# Patient Record
Sex: Male | Born: 1950 | Hispanic: Yes | Marital: Married | Smoking: Former smoker
Health system: Southern US, Community
[De-identification: ages and names within clinical notes are randomized; demographics above are authoritative.]

## PROBLEM LIST (undated history)

## (undated) HISTORY — PX: VARICOSE VEIN SURGERY: SHX832

---

## 2014-12-12 ENCOUNTER — Encounter: Payer: Self-pay | Admitting: Emergency Medicine

## 2014-12-12 ENCOUNTER — Emergency Department: Payer: BLUE CROSS/BLUE SHIELD

## 2014-12-12 ENCOUNTER — Emergency Department
Admission: EM | Admit: 2014-12-12 | Discharge: 2014-12-12 | Disposition: A | Discharge status: Home / Self Care | Payer: BLUE CROSS/BLUE SHIELD | Attending: Student | Admitting: Student

## 2014-12-12 DIAGNOSIS — M549 Dorsalgia, unspecified: Secondary | ICD-10-CM | POA: Diagnosis present

## 2014-12-12 DIAGNOSIS — M4686 Other specified inflammatory spondylopathies, lumbar region: Secondary | ICD-10-CM | POA: Insufficient documentation

## 2014-12-12 DIAGNOSIS — Z87891 Personal history of nicotine dependence: Secondary | ICD-10-CM | POA: Diagnosis not present

## 2014-12-12 DIAGNOSIS — M79661 Pain in right lower leg: Secondary | ICD-10-CM | POA: Diagnosis not present

## 2014-12-12 DIAGNOSIS — M479 Spondylosis, unspecified: Secondary | ICD-10-CM

## 2014-12-12 LAB — URINALYSIS COMPLETE WITH MICROSCOPIC (ARMC ONLY)
Bacteria, UA: NONE SEEN
Bilirubin Urine: NEGATIVE
Glucose, UA: NEGATIVE mg/dL
Ketones, ur: NEGATIVE mg/dL
Leukocytes, UA: NEGATIVE
Nitrite: NEGATIVE
Protein, ur: NEGATIVE mg/dL
Specific Gravity, Urine: 1.016 (ref 1.005–1.030)
pH: 7 (ref 5.0–8.0)

## 2014-12-12 MED ORDER — IBUPROFEN 800 MG PO TABS
800.0000 mg | ORAL_TABLET | Freq: Once | ORAL | Status: AC
  Administered 2014-12-12: 800 mg via ORAL
  Filled 2014-12-12: qty 1

## 2014-12-12 MED ORDER — CYCLOBENZAPRINE HCL 10 MG PO TABS
5.0000 mg | ORAL_TABLET | Freq: Once | ORAL | Status: AC
Start: 2014-12-12 — End: 2014-12-12
  Administered 2014-12-12: 5 mg via ORAL
  Filled 2014-12-12: qty 1

## 2014-12-12 MED ORDER — MELOXICAM 15 MG PO TABS: 15.0000 mg | ORAL_TABLET | Freq: Every day | ORAL | Status: AC

## 2014-12-12 MED ORDER — TRAMADOL HCL 50 MG PO TABS
50.0000 mg | ORAL_TABLET | Freq: Once | ORAL | Status: AC
  Administered 2014-12-12: 50 mg via ORAL
  Filled 2014-12-12: qty 1

## 2014-12-12 MED ORDER — TRAMADOL HCL 50 MG PO TABS: 50.0000 mg | ORAL_TABLET | Freq: Four times a day (QID) | ORAL | Status: AC | PRN

## 2014-12-12 MED ORDER — CYCLOBENZAPRINE HCL 10 MG PO TABS: 10.0000 mg | ORAL_TABLET | Freq: Three times a day (TID) | ORAL | Status: AC | PRN

## 2014-12-12 NOTE — ED Notes (Signed)
Patient presents to the ED with lower back pain that started 3 days ago and right leg pain that started today.  Patient denies any injury.  No obvious swelling or redness to right leg.  Patient ambulatory, able to move feet and wiggle toes.

## 2014-12-12 NOTE — ED Notes (Signed)
Pt has right lower back pain x 4 days and now pain radiates into right leg/foot.  States painful to stand and walk.  No known injury

## 2014-12-12 NOTE — ED Provider Notes (Signed)
[facility] Emergency Department Provider Note  ____________________________________________  Time seen: Approximately 7:34 PM  I have reviewed the triage vital signs and the nursing notes.   HISTORY  Chief Complaint Leg Pain and Back Pain    HPI Jeremiah Stewart is a 64 y.o. male patient complaining of 3 days of back pain radiating down the right leg. He is also complaining of right flank pain. Patient denies any provocative incident for this complaint. Patient stated no palliative measures taken for this complaint. No prior history of back pain. Patient rates his pain as 8/10. Patient describes pain as sharp.   History reviewed. No pertinent past medical history.  There are no active problems to display for this patient.   Past Surgical History  Procedure Laterality Date  . Varicose vein surgery      No current outpatient prescriptions on file.  Allergies Review of patient's allergies indicates no known allergies.  No family history on file.  Social History History  Substance Use Topics  . Smoking status: Former Smoker -- 10 years  . Smokeless tobacco: Not on file  . Alcohol Use: No    Review of Systems Constitutional: No fever/chills Eyes: No visual changes. ENT: No sore throat. Cardiovascular: Denies chest pain. Respiratory: Denies shortness of breath. Gastrointestinal: No abdominal pain.  No nausea, no vomiting.  No diarrhea.  No constipation. Genitourinary: Negative for dysuria. Musculoskeletal: Positive for back pain. Skin: Negative for rash. Neurological: Negative for headaches, focal weakness or numbness.  10-point ROS otherwise negative.  ____________________________________________   PHYSICAL EXAM:  VITAL SIGNS: ED Triage Vitals  Enc Vitals Group     BP 12/12/14 1840 151/85 mmHg     Pulse Rate 12/12/14 1840 84     Resp 12/12/14 1840 16     Temp 12/12/14 1840 97.8 F (36.6 C)     Temp Source 12/12/14 1840 Oral      SpO2 12/12/14 1840 95 %     Weight 12/12/14 1840 241 lb (109.317 kg)     Height 12/12/14 1840 5\' 8"  (1.727 m)     Head Cir --      Peak Flow --      Pain Score 12/12/14 1841 8     Pain Loc --      Pain Edu? --      Excl. in GC? --     Constitutional: Alert and oriented. Well appearing and in no acute distress. Eyes: Conjunctivae are normal. PERRL. EOMI. Head: Atraumatic. Nose: No congestion/rhinnorhea. Mouth/Throat: Mucous membranes are moist.  Oropharynx non-erythematous. Neck: No stridor.  No cervical spine tenderness to palpation. Hematological/Lymphatic/Immunilogical: No cervical lymphadenopathy. Cardiovascular: Normal rate, regular rhythm. Grossly normal heart sounds.  Good peripheral circulation. Respiratory: Normal respiratory effort.  No retractions. Lungs CTAB. Gastrointestinal: Soft and nontender. No distention. No abdominal bruits. No CVA tenderness. Musculoskeletal: No spinal deformity decreased range of motion right lateral movements. Paraspinal muscle spasms on the right side. Patient negative straight leg test. Neurologic:  Normal speech and language. No gross focal neurologic deficits are appreciated. No gait instability. Skin:  Skin is warm, dry and intact. No rash noted. Psychiatric: Mood and affect are normal. Speech and behavior are normal.  ____________________________________________   LABS (all labs ordered are listed, but only abnormal results are displayed)  Labs Reviewed  URINALYSIS COMPLETEWITH MICROSCOPIC ([facility] ONLY) - Abnormal; Notable for the following:    Color, Urine YELLOW (*)    APPearance CLEAR (*)    Hgb urine dipstick 2+ (*)  Squamous Epithelial / LPF 0-5 (*)    All other components within normal limits   ____________________________________________  EKG  ____________________________________________  RADIOLOGY  CT scan was negative for kidney stones. Multilevel arthritic changes were found on x-ray  . ____________________________________________   PROCEDURES  Procedure(s) performed: None  Critical Care performed: No  ____________________________________________   INITIAL IMPRESSION / ASSESSMENT AND PLAN / ED COURSE  Pertinent labs & imaging results that were available during my care of the patient were reviewed by me and considered in my medical decision making (see chart for details).  Arthritis. Discuss CT and x-ray results with patient. Patient was given a prescription for [place] tramadol and Flexeril. [facility]. Advised return to ER if condition worsens. ____________________________________________   FINAL CLINICAL IMPRESSION(S) / ED DIAGNOSES  Final diagnoses:  Arthritis of back      Joni Reining, PA-C 12/12/14 2135  Gayla Doss, MD 12/12/14 2350

## 2015-05-20 ENCOUNTER — Emergency Department: Payer: BLUE CROSS/BLUE SHIELD

## 2015-05-20 ENCOUNTER — Emergency Department
Admission: EM | Admit: 2015-05-20 | Discharge: 2015-05-20 | Disposition: A | Discharge status: Home / Self Care | Payer: BLUE CROSS/BLUE SHIELD | Attending: Emergency Medicine | Admitting: Emergency Medicine

## 2015-05-20 DIAGNOSIS — Z87891 Personal history of nicotine dependence: Secondary | ICD-10-CM | POA: Diagnosis not present

## 2015-05-20 DIAGNOSIS — Y9289 Other specified places as the place of occurrence of the external cause: Secondary | ICD-10-CM | POA: Insufficient documentation

## 2015-05-20 DIAGNOSIS — S0083XA Contusion of other part of head, initial encounter: Secondary | ICD-10-CM

## 2015-05-20 DIAGNOSIS — Y998 Other external cause status: Secondary | ICD-10-CM | POA: Insufficient documentation

## 2015-05-20 DIAGNOSIS — S060X0A Concussion without loss of consciousness, initial encounter: Secondary | ICD-10-CM | POA: Diagnosis not present

## 2015-05-20 DIAGNOSIS — S0121XA Laceration without foreign body of nose, initial encounter: Secondary | ICD-10-CM | POA: Diagnosis not present

## 2015-05-20 DIAGNOSIS — Z23 Encounter for immunization: Secondary | ICD-10-CM | POA: Diagnosis not present

## 2015-05-20 DIAGNOSIS — Z791 Long term (current) use of non-steroidal anti-inflammatories (NSAID): Secondary | ICD-10-CM | POA: Insufficient documentation

## 2015-05-20 DIAGNOSIS — Y9389 Activity, other specified: Secondary | ICD-10-CM | POA: Insufficient documentation

## 2015-05-20 DIAGNOSIS — S0181XA Laceration without foreign body of other part of head, initial encounter: Secondary | ICD-10-CM

## 2015-05-20 DIAGNOSIS — S0990XA Unspecified injury of head, initial encounter: Secondary | ICD-10-CM | POA: Diagnosis present

## 2015-05-20 MED ORDER — TETANUS-DIPHTH-ACELL PERTUSSIS 5-2.5-18.5 LF-MCG/0.5 IM SUSP
0.5000 mL | Freq: Once | INTRAMUSCULAR | Status: AC
  Administered 2015-05-20: 0.5 mL via INTRAMUSCULAR
  Filled 2015-05-20: qty 0.5

## 2015-05-20 MED ORDER — HYDROCODONE-ACETAMINOPHEN 5-325 MG PO TABS
1.0000 | ORAL_TABLET | ORAL | Status: AC
  Administered 2015-05-20: 1 via ORAL
  Filled 2015-05-20: qty 1

## 2015-05-20 MED ORDER — HYDROCODONE-ACETAMINOPHEN 5-325 MG PO TABS: 1.0000 | ORAL_TABLET | Freq: Four times a day (QID) | ORAL | Status: AC | PRN

## 2015-05-20 NOTE — Discharge Instructions (Signed)
Concussion, Adult A concussion, or closed-head injury, is a brain injury caused by a direct blow to the head or by a quick and sudden movement (jolt) of the head or neck. Concussions are usually not life-threatening. Even so, the effects of a concussion can be serious. If you have had a concussion before, you are more likely to experience concussion-like symptoms after a direct blow to the head.  CAUSES  Direct blow to the head, such as from running into another player during a soccer game, being hit in a fight, or hitting your head on a hard surface.  A jolt of the head or neck that causes the brain to move back and forth inside the skull, such as in a car crash. SIGNS AND SYMPTOMS The signs of a concussion can be hard to notice. Early on, they may be missed by you, family members, and health care providers. You may look fine but act or feel differently. Symptoms are usually temporary, but they may last for days, weeks, or even longer. Some symptoms may appear right away while others may not show up for hours or days. Every head injury is different. Symptoms include:  Mild to moderate headaches that will not go away.  A feeling of pressure inside your head.  Having more trouble than usual:  Learning or remembering things you have heard.  Answering questions.  Paying attention or concentrating.  Organizing daily tasks.  Making decisions and solving problems.  Slowness in thinking, acting or reacting, speaking, or reading.  Getting lost or being easily confused.  Feeling tired all the time or lacking energy (fatigued).  Feeling drowsy.  Sleep disturbances.  Sleeping more than usual.  Sleeping less than usual.  Trouble falling asleep.  Trouble sleeping (insomnia).  Loss of balance or feeling lightheaded or dizzy.  Nausea or vomiting.  Numbness or tingling.  Increased sensitivity to:  Sounds.  Lights.  Distractions.  Vision problems or eyes that tire  easily.  Diminished sense of taste or smell.  Ringing in the ears.  Mood changes such as feeling sad or anxious.  Becoming easily irritated or angry for little or no reason.  Lack of motivation.  Seeing or hearing things other people do not see or hear (hallucinations). DIAGNOSIS Your health care provider can usually diagnose a concussion based on a description of your injury and symptoms. He or she will ask whether you passed out (lost consciousness) and whether you are having trouble remembering events that happened right before and during your injury. Your evaluation might include:  A brain scan to look for signs of injury to the brain. Even if the test shows no injury, you may still have a concussion.  Blood tests to be sure other problems are not present. TREATMENT  Concussions are usually treated in an emergency department, in urgent care, or at a clinic. You may need to stay in the hospital overnight for further treatment.  Tell your health care provider if you are taking any medicines, including prescription medicines, over-the-counter medicines, and natural remedies. Some medicines, such as blood thinners (anticoagulants) and aspirin, may increase the chance of complications. Also tell your health care provider whether you have had alcohol or are taking illegal drugs. This information may affect treatment.  Your health care provider will send you home with important instructions to follow.  How fast you will recover from a concussion depends on many factors. These factors include how severe your concussion is, what part of your brain was injured,  your age, and how healthy you were before the concussion.  Most people with mild injuries recover fully. Recovery can take time. In general, recovery is slower in older persons. Also, persons who have had a concussion in the past or have other medical problems may find that it takes longer to recover from their current injury. HOME  CARE INSTRUCTIONS General Instructions  Carefully follow the directions your health care provider gave you.  Only take over-the-counter or prescription medicines for pain, discomfort, or fever as directed by your health care provider.  Take only those medicines that your health care provider has approved.  Do not drink alcohol until your health care provider says you are well enough to do so. Alcohol and certain other drugs may slow your recovery and can put you at risk of further injury.  If it is harder than usual to remember things, write them down.  If you are easily distracted, try to do one thing at a time. For example, do not try to watch TV while fixing dinner.  Talk with family members or close friends when making important decisions.  Keep all follow-up appointments. Repeated evaluation of your symptoms is recommended for your recovery.  Watch your symptoms and tell others to do the same. Complications sometimes occur after a concussion. Older adults with a brain injury may have a higher risk of serious complications, such as a blood clot on the brain.  Tell your teachers, school nurse, school counselor, coach, athletic trainer, or work Freight forwarder about your injury, symptoms, and restrictions. Tell them about what you can or cannot do. They should watch for:  Increased problems with attention or concentration.  Increased difficulty remembering or learning new information.  Increased time needed to complete tasks or assignments.  Increased irritability or decreased ability to cope with stress.  Increased symptoms.  Rest. Rest helps the brain to heal. Make sure you:  Get plenty of sleep at night. Avoid staying up late at night.  Keep the same bedtime hours on weekends and weekdays.  Rest during the day. Take daytime naps or rest breaks when you feel tired.  Limit activities that require a lot of thought or concentration. These include:  Doing homework or job-related  work.  Watching TV.  Working on the computer.  Avoid any situation where there is potential for another head injury (football, hockey, soccer, basketball, martial arts, downhill snow sports and horseback riding). Your condition will get worse every time you experience a concussion. You should avoid these activities until you are evaluated by the appropriate follow-up health care providers. Returning To Your Regular Activities You will need to return to your normal activities slowly, not all at once. You must give your body and brain enough time for recovery.  Do not return to sports or other athletic activities until your health care provider tells you it is safe to do so.  Ask your health care provider when you can drive, ride a bicycle, or operate heavy machinery. Your ability to react may be slower after a brain injury. Never do these activities if you are dizzy.  Ask your health care provider about when you can return to work or school. Preventing Another Concussion It is very important to avoid another brain injury, especially before you have recovered. In rare cases, another injury can lead to permanent brain damage, brain swelling, or death. The risk of this is greatest during the first 7-10 days after a head injury. Avoid injuries by:  Wearing a  seat belt when riding in a car.  Drinking alcohol only in moderation.  Wearing a helmet when biking, skiing, skateboarding, skating, or doing similar activities.  Avoiding activities that could lead to a second concussion, such as contact or recreational sports, until your health care provider says it is okay.  Taking safety measures in your home.  Remove clutter and tripping hazards from floors and stairways.  Use grab bars in bathrooms and handrails by stairs.  Place non-slip mats on floors and in bathtubs.  Improve lighting in dim areas. SEEK MEDICAL CARE IF:  You have increased problems paying attention or  concentrating.  You have increased difficulty remembering or learning new information.  You need more time to complete tasks or assignments than before.  You have increased irritability or decreased ability to cope with stress.  You have more symptoms than before. Seek medical care if you have any of the following symptoms for more than 2 weeks after your injury:  Lasting (chronic) headaches.  Dizziness or balance problems.  Nausea.  Vision problems.  Increased sensitivity to noise or light.  Depression or mood swings.  Anxiety or irritability.  Memory problems.  Difficulty concentrating or paying attention.  Sleep problems.  Feeling tired all the time. SEEK IMMEDIATE MEDICAL CARE IF:  You have severe or worsening headaches. These may be a sign of a blood clot in the brain.  You have weakness (even if only in one hand, leg, or part of the face).  You have numbness.  You have decreased coordination.  You vomit repeatedly.  You have increased sleepiness.  One pupil is larger than the other.  You have convulsions.  You have slurred speech.  You have increased confusion. This may be a sign of a blood clot in the brain.  You have increased restlessness, agitation, or irritability.  You are unable to recognize people or places.  You have neck pain.  It is difficult to wake you up.  You have unusual behavior changes.  You lose consciousness. MAKE SURE YOU:  Understand these instructions.  Will watch your condition.  Will get help right away if you are not doing well or get worse.   This information is not intended to replace advice given to you by your health care provider. Make sure you discuss any questions you have with your health care provider.   Document Released: 07/14/2003 Document Revised: 05/14/2014 Document Reviewed: 11/13/2012 Elsevier Interactive Patient Education 2016 [place] A contusion is a deep bruise.  Contusions are the result of a blunt injury to tissues and muscle fibers under the skin. The injury causes bleeding under the skin. The skin overlying the contusion may turn blue, purple, or yellow. Minor injuries will give you a painless contusion, but more severe contusions may stay painful and swollen for a few weeks.  CAUSES  This condition is usually caused by a blow, trauma, or direct force to an area of the body. SYMPTOMS  Symptoms of this condition include:  Swelling of the injured area.  Pain and tenderness in the injured area.  Discoloration. The area may have redness and then turn blue, purple, or yellow. DIAGNOSIS  This condition is diagnosed based on a physical exam and medical history. An X-ray, CT scan, or MRI may be needed to determine if there are any associated injuries, such as broken bones (fractures). TREATMENT  Specific treatment for this condition depends on what area of the body was injured. In general, the best treatment  for a contusion is resting, icing, applying pressure to (compression), and elevating the injured area. This is often called the RICE strategy. Over-the-counter anti-inflammatory medicines may also be recommended for pain control.  HOME CARE INSTRUCTIONS   Rest the injured area.  If directed, apply ice to the injured area:  Put ice in a plastic bag.  Place a towel between your skin and the bag.  Leave the ice on for 20 minutes, 2-3 times per day.  If directed, apply light compression to the injured area using an elastic bandage. Make sure the bandage is not wrapped too tightly. Remove and reapply the bandage as directed by your health care provider.  If possible, raise (elevate) the injured area above the level of your heart while you are sitting or lying down.  Take over-the-counter and prescription medicines only as told by your health care provider. SEEK MEDICAL CARE IF:  Your symptoms do not improve after several days of  treatment.  Your symptoms get worse.  You have difficulty moving the injured area. SEEK IMMEDIATE MEDICAL CARE IF:   You have severe pain.  You have numbness in a hand or foot.  Your hand or foot turns pale or cold.   This information is not intended to replace advice given to you by your health care provider. Make sure you discuss any questions you have with your health care provider.   Document Released: 01/31/2005 Document Revised: 01/12/2015 Document Reviewed: 09/08/2014 Elsevier Interactive Patient Education 2016 [employer]  Cryotherapy Cryotherapy means treatment with cold. Ice or gel packs can be used to reduce both pain and swelling. Ice is the most helpful within the first 24 to 48 hours after an injury or flare-up from overusing a muscle or joint. Sprains, strains, spasms, burning pain, shooting pain, and aches can all be eased with ice. Ice can also be used when recovering from surgery. Ice is effective, has very few side effects, and is safe for most people to use. PRECAUTIONS  Ice is not a safe treatment option for people with:  Raynaud phenomenon. This is a condition affecting small blood vessels in the extremities. Exposure to cold may cause your problems to return.  Cold hypersensitivity. There are many forms of cold hypersensitivity, including:  Cold urticaria. Red, itchy hives appear on the skin when the tissues begin to warm after being iced.  Cold erythema. This is a red, itchy rash caused by exposure to cold.  Cold hemoglobinuria. Red blood cells break down when the tissues begin to warm after being iced. The hemoglobin that carry oxygen are passed into the urine because they cannot combine with blood proteins fast enough.  Numbness or altered sensitivity in the area being iced. If you have any of the following conditions, do not use ice until you have discussed cryotherapy with your caregiver:  Heart conditions, such as arrhythmia, angina, or chronic  heart disease.  High blood pressure.  Healing wounds or open skin in the area being iced.  Current infections.  Rheumatoid arthritis.  Poor circulation.  Diabetes. Ice slows the blood flow in the region it is applied. This is beneficial when trying to stop inflamed tissues from spreading irritating chemicals to surrounding tissues. However, if you expose your skin to cold temperatures for too long or without the proper protection, you can damage your skin or nerves. Watch for signs of skin damage due to cold. HOME CARE INSTRUCTIONS Follow these tips to use ice and cold packs safely.  Place a dry  or damp towel between the ice and skin. A damp towel will cool the skin more quickly, so you may need to shorten the time that the ice is used.  For a more rapid response, add gentle compression to the ice.  Ice for no more than 10 to 20 minutes at a time. The bonier the area you are icing, the less time it will take to get the benefits of ice.  Check your skin after 5 minutes to make sure there are no signs of a poor response to cold or skin damage.  Rest 20 minutes or more between uses.  Once your skin is numb, you can end your treatment. You can test numbness by very lightly touching your skin. The touch should be so light that you do not see the skin dimple from the pressure of your fingertip. When using ice, most people will feel these normal sensations in this order: cold, burning, aching, and numbness.  Do not use ice on someone who cannot communicate their responses to pain, such as small children or people with dementia. HOW TO MAKE AN ICE PACK Ice packs are the most common way to use ice therapy. Other methods include ice massage, ice baths, and cryosprays. Muscle creams that cause a cold, tingly feeling do not offer the same benefits that ice offers and should not be used as a substitute unless recommended by your caregiver. To make an ice pack, do one of the following:  Place  crushed ice or a bag of frozen vegetables in a sealable plastic bag. Squeeze out the excess air. Place this bag inside another plastic bag. Slide the bag into a pillowcase or place a damp towel between your skin and the bag.  Mix 3 parts water with 1 part rubbing alcohol. Freeze the mixture in a sealable plastic bag. When you remove the mixture from the freezer, it will be slushy. Squeeze out the excess air. Place this bag inside another plastic bag. Slide the bag into a pillowcase or place a damp towel between your skin and the bag. SEEK MEDICAL CARE IF:  You develop white spots on your skin. This may give the skin a blotchy (mottled) appearance.  Your skin turns blue or pale.  Your skin becomes waxy or hard.  Your swelling gets worse. MAKE SURE YOU:   Understand these instructions.  Will watch your condition.  Will get help right away if you are not doing well or get worse.   This information is not intended to replace advice given to you by your health care provider. Make sure you discuss any questions you have with your health care provider.   Document Released: 12/18/2010 Document Revised: 05/14/2014 Document Reviewed: 12/18/2010 Elsevier Interactive Patient Education 2016 [employer]  Facial Laceration  A facial laceration is a cut on the face. These injuries can be painful and cause bleeding. Lacerations usually heal quickly, but they need special care to reduce scarring. DIAGNOSIS  Your health care provider will take a medical history, ask for details about how the injury occurred, and examine the wound to determine how deep the cut is. TREATMENT  Some facial lacerations may not require closure. Others may not be able to be closed because of an increased risk of infection. The risk of infection and the chance for successful closure will depend on various factors, including the amount of time since the injury occurred. The wound may be cleaned to help prevent infection. If  closure is appropriate, pain medicines  may be given if needed. Your health care provider will use stitches (sutures), wound glue (adhesive), or skin adhesive strips to repair the laceration. These tools bring the skin edges together to allow for faster healing and a better cosmetic outcome. If needed, you may also be given a tetanus shot. HOME CARE INSTRUCTIONS  Only take over-the-counter or prescription medicines as directed by your health care provider.  Follow your health care provider's instructions for wound care. These instructions will vary depending on the technique used for closing the wound. For Sutures:  Keep the wound clean and dry.   If you were given a bandage (dressing), you should change it at least once a day. Also change the dressing if it becomes wet or dirty, or as directed by your health care provider.   Wash the wound with soap and water 2 times a day. Rinse the wound off with water to remove all soap. Pat the wound dry with a clean towel.   After cleaning, apply a thin layer of the antibiotic ointment recommended by your health care provider. This will help prevent infection and keep the dressing from sticking.   You may shower as usual after the first 24 hours. Do not soak the wound in water until the sutures are removed.   Get your sutures removed as directed by your health care provider. With facial lacerations, sutures should usually be taken out after 4-5 days to avoid stitch marks.   Wait a few days after your sutures are removed before applying any makeup. For Skin Adhesive Strips:  Keep the wound clean and dry.   Do not get the skin adhesive strips wet. You may bathe carefully, using caution to keep the wound dry.   If the wound gets wet, pat it dry with a clean towel.   Skin adhesive strips will fall off on their own. You may trim the strips as the wound heals. Do not remove skin adhesive strips that are still stuck to the wound. They will fall  off in time.  For Wound Adhesive:  You may briefly wet your wound in the shower or bath. Do not soak or scrub the wound. Do not swim. Avoid periods of heavy sweating until the skin adhesive has fallen off on its own. After showering or bathing, gently pat the wound dry with a clean towel.   Do not apply liquid medicine, cream medicine, ointment medicine, or makeup to your wound while the skin adhesive is in place. This may loosen the film before your wound is healed.   If a dressing is placed over the wound, be careful not to apply tape directly over the skin adhesive. This may cause the adhesive to be pulled off before the wound is healed.   Avoid prolonged exposure to sunlight or tanning lamps while the skin adhesive is in place.  The skin adhesive will usually remain in place for 5-10 days, then naturally fall off the skin. Do not pick at the adhesive film.  After Healing: Once the wound has healed, cover the wound with sunscreen during the day for 1 full year. This can help minimize scarring. Exposure to ultraviolet light in the first year will darken the scar. It can take 1-2 years for the scar to lose its redness and to heal completely.  SEEK MEDICAL CARE IF:  You have a fever. SEEK IMMEDIATE MEDICAL CARE IF:  You have redness, pain, or swelling around the wound.   You see ayellowish-white fluid (pus) coming  from the wound.    This information is not intended to replace advice given to you by your health care provider. Make sure you discuss any questions you have with your health care provider.   Document Released: 05/31/2004 Document Revised: 05/14/2014 Document Reviewed: 12/04/2012 Elsevier Interactive Patient Education [employer]
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
## 2017-09-25 ENCOUNTER — Other Ambulatory Visit: Payer: Self-pay | Admitting: Unknown Physician Specialty

## 2017-09-25 DIAGNOSIS — M1711 Unilateral primary osteoarthritis, right knee: Secondary | ICD-10-CM

## 2017-11-27 ENCOUNTER — Ambulatory Visit
Admission: RE | Admit: 2017-11-27 | Discharge: 2017-11-27 | Disposition: A | Discharge status: Home / Self Care | Payer: Medicare HMO | Source: Ambulatory Visit | Attending: Unknown Physician Specialty | Admitting: Unknown Physician Specialty

## 2017-11-27 DIAGNOSIS — M1711 Unilateral primary osteoarthritis, right knee: Secondary | ICD-10-CM | POA: Insufficient documentation
